# Patient Record
Sex: Female | Born: 1935 | Race: Black or African American | Hispanic: No | Marital: Married | Smoking: Never smoker
Health system: Southern US, Community
[De-identification: ages and names within clinical notes are randomized; demographics above are authoritative.]

## PROBLEM LIST (undated history)

## (undated) DIAGNOSIS — I10 Essential (primary) hypertension: Secondary | ICD-10-CM

## (undated) DIAGNOSIS — E785 Hyperlipidemia, unspecified: Secondary | ICD-10-CM

## (undated) DIAGNOSIS — T7840XA Allergy, unspecified, initial encounter: Secondary | ICD-10-CM

## (undated) HISTORY — DX: Allergy, unspecified, initial encounter: T78.40XA

## (undated) HISTORY — PX: ABDOMINAL HYSTERECTOMY: SHX81

## (undated) HISTORY — DX: Hyperlipidemia, unspecified: E78.5

## (undated) HISTORY — DX: Essential (primary) hypertension: I10

---

## 2015-01-02 DIAGNOSIS — I10 Essential (primary) hypertension: Secondary | ICD-10-CM | POA: Diagnosis not present

## 2015-01-02 DIAGNOSIS — E559 Vitamin D deficiency, unspecified: Secondary | ICD-10-CM | POA: Diagnosis not present

## 2015-01-02 DIAGNOSIS — M8580 Other specified disorders of bone density and structure, unspecified site: Secondary | ICD-10-CM | POA: Diagnosis not present

## 2015-01-04 DIAGNOSIS — K219 Gastro-esophageal reflux disease without esophagitis: Secondary | ICD-10-CM | POA: Diagnosis not present

## 2015-01-04 DIAGNOSIS — I10 Essential (primary) hypertension: Secondary | ICD-10-CM | POA: Diagnosis not present

## 2015-01-04 DIAGNOSIS — E785 Hyperlipidemia, unspecified: Secondary | ICD-10-CM | POA: Diagnosis not present

## 2015-01-04 DIAGNOSIS — G459 Transient cerebral ischemic attack, unspecified: Secondary | ICD-10-CM | POA: Diagnosis not present

## 2015-01-18 DIAGNOSIS — Z1231 Encounter for screening mammogram for malignant neoplasm of breast: Secondary | ICD-10-CM | POA: Diagnosis not present

## 2015-02-22 DIAGNOSIS — M545 Low back pain: Secondary | ICD-10-CM | POA: Diagnosis not present

## 2015-02-22 DIAGNOSIS — R52 Pain, unspecified: Secondary | ICD-10-CM | POA: Diagnosis not present

## 2015-02-22 DIAGNOSIS — M549 Dorsalgia, unspecified: Secondary | ICD-10-CM | POA: Diagnosis not present

## 2015-02-28 DIAGNOSIS — M5416 Radiculopathy, lumbar region: Secondary | ICD-10-CM | POA: Diagnosis not present

## 2015-03-17 DIAGNOSIS — M5416 Radiculopathy, lumbar region: Secondary | ICD-10-CM | POA: Diagnosis not present

## 2015-03-20 DIAGNOSIS — M5416 Radiculopathy, lumbar region: Secondary | ICD-10-CM | POA: Diagnosis not present

## 2015-03-26 DIAGNOSIS — R262 Difficulty in walking, not elsewhere classified: Secondary | ICD-10-CM | POA: Diagnosis not present

## 2015-03-26 DIAGNOSIS — M5416 Radiculopathy, lumbar region: Secondary | ICD-10-CM | POA: Diagnosis not present

## 2015-03-26 DIAGNOSIS — R293 Abnormal posture: Secondary | ICD-10-CM | POA: Diagnosis not present

## 2015-03-26 DIAGNOSIS — M545 Low back pain: Secondary | ICD-10-CM | POA: Diagnosis not present

## 2015-03-28 DIAGNOSIS — R293 Abnormal posture: Secondary | ICD-10-CM | POA: Diagnosis not present

## 2015-03-28 DIAGNOSIS — M545 Low back pain: Secondary | ICD-10-CM | POA: Diagnosis not present

## 2015-03-28 DIAGNOSIS — M5416 Radiculopathy, lumbar region: Secondary | ICD-10-CM | POA: Diagnosis not present

## 2015-03-28 DIAGNOSIS — R262 Difficulty in walking, not elsewhere classified: Secondary | ICD-10-CM | POA: Diagnosis not present

## 2015-04-02 DIAGNOSIS — M545 Low back pain: Secondary | ICD-10-CM | POA: Diagnosis not present

## 2015-04-02 DIAGNOSIS — M5416 Radiculopathy, lumbar region: Secondary | ICD-10-CM | POA: Diagnosis not present

## 2015-04-02 DIAGNOSIS — R262 Difficulty in walking, not elsewhere classified: Secondary | ICD-10-CM | POA: Diagnosis not present

## 2015-04-02 DIAGNOSIS — R293 Abnormal posture: Secondary | ICD-10-CM | POA: Diagnosis not present

## 2015-04-04 DIAGNOSIS — R293 Abnormal posture: Secondary | ICD-10-CM | POA: Diagnosis not present

## 2015-04-04 DIAGNOSIS — M545 Low back pain: Secondary | ICD-10-CM | POA: Diagnosis not present

## 2015-04-04 DIAGNOSIS — M5416 Radiculopathy, lumbar region: Secondary | ICD-10-CM | POA: Diagnosis not present

## 2015-04-04 DIAGNOSIS — R262 Difficulty in walking, not elsewhere classified: Secondary | ICD-10-CM | POA: Diagnosis not present

## 2015-04-09 DIAGNOSIS — M545 Low back pain: Secondary | ICD-10-CM | POA: Diagnosis not present

## 2015-04-09 DIAGNOSIS — M5416 Radiculopathy, lumbar region: Secondary | ICD-10-CM | POA: Diagnosis not present

## 2015-04-09 DIAGNOSIS — R293 Abnormal posture: Secondary | ICD-10-CM | POA: Diagnosis not present

## 2015-04-09 DIAGNOSIS — R262 Difficulty in walking, not elsewhere classified: Secondary | ICD-10-CM | POA: Diagnosis not present

## 2015-04-11 DIAGNOSIS — R262 Difficulty in walking, not elsewhere classified: Secondary | ICD-10-CM | POA: Diagnosis not present

## 2015-04-11 DIAGNOSIS — M545 Low back pain: Secondary | ICD-10-CM | POA: Diagnosis not present

## 2015-04-11 DIAGNOSIS — M5416 Radiculopathy, lumbar region: Secondary | ICD-10-CM | POA: Diagnosis not present

## 2015-04-11 DIAGNOSIS — R293 Abnormal posture: Secondary | ICD-10-CM | POA: Diagnosis not present

## 2015-04-13 DIAGNOSIS — R293 Abnormal posture: Secondary | ICD-10-CM | POA: Diagnosis not present

## 2015-04-13 DIAGNOSIS — R262 Difficulty in walking, not elsewhere classified: Secondary | ICD-10-CM | POA: Diagnosis not present

## 2015-04-13 DIAGNOSIS — M545 Low back pain: Secondary | ICD-10-CM | POA: Diagnosis not present

## 2015-04-13 DIAGNOSIS — M5416 Radiculopathy, lumbar region: Secondary | ICD-10-CM | POA: Diagnosis not present

## 2015-04-18 DIAGNOSIS — R262 Difficulty in walking, not elsewhere classified: Secondary | ICD-10-CM | POA: Diagnosis not present

## 2015-04-18 DIAGNOSIS — R293 Abnormal posture: Secondary | ICD-10-CM | POA: Diagnosis not present

## 2015-04-18 DIAGNOSIS — M545 Low back pain: Secondary | ICD-10-CM | POA: Diagnosis not present

## 2015-04-18 DIAGNOSIS — M5416 Radiculopathy, lumbar region: Secondary | ICD-10-CM | POA: Diagnosis not present

## 2015-04-20 DIAGNOSIS — R262 Difficulty in walking, not elsewhere classified: Secondary | ICD-10-CM | POA: Diagnosis not present

## 2015-04-20 DIAGNOSIS — M545 Low back pain: Secondary | ICD-10-CM | POA: Diagnosis not present

## 2015-04-20 DIAGNOSIS — R293 Abnormal posture: Secondary | ICD-10-CM | POA: Diagnosis not present

## 2015-04-20 DIAGNOSIS — M5416 Radiculopathy, lumbar region: Secondary | ICD-10-CM | POA: Diagnosis not present

## 2015-04-23 DIAGNOSIS — M5416 Radiculopathy, lumbar region: Secondary | ICD-10-CM | POA: Diagnosis not present

## 2015-04-23 DIAGNOSIS — R293 Abnormal posture: Secondary | ICD-10-CM | POA: Diagnosis not present

## 2015-04-23 DIAGNOSIS — M545 Low back pain: Secondary | ICD-10-CM | POA: Diagnosis not present

## 2015-04-23 DIAGNOSIS — R262 Difficulty in walking, not elsewhere classified: Secondary | ICD-10-CM | POA: Diagnosis not present

## 2015-04-25 DIAGNOSIS — R262 Difficulty in walking, not elsewhere classified: Secondary | ICD-10-CM | POA: Diagnosis not present

## 2015-04-25 DIAGNOSIS — M5416 Radiculopathy, lumbar region: Secondary | ICD-10-CM | POA: Diagnosis not present

## 2015-04-25 DIAGNOSIS — M545 Low back pain: Secondary | ICD-10-CM | POA: Diagnosis not present

## 2015-04-25 DIAGNOSIS — R293 Abnormal posture: Secondary | ICD-10-CM | POA: Diagnosis not present

## 2015-04-30 DIAGNOSIS — M5416 Radiculopathy, lumbar region: Secondary | ICD-10-CM | POA: Diagnosis not present

## 2015-04-30 DIAGNOSIS — M545 Low back pain: Secondary | ICD-10-CM | POA: Diagnosis not present

## 2015-04-30 DIAGNOSIS — R293 Abnormal posture: Secondary | ICD-10-CM | POA: Diagnosis not present

## 2015-04-30 DIAGNOSIS — R262 Difficulty in walking, not elsewhere classified: Secondary | ICD-10-CM | POA: Diagnosis not present

## 2015-05-02 DIAGNOSIS — M545 Low back pain: Secondary | ICD-10-CM | POA: Diagnosis not present

## 2015-05-02 DIAGNOSIS — R293 Abnormal posture: Secondary | ICD-10-CM | POA: Diagnosis not present

## 2015-05-02 DIAGNOSIS — M5416 Radiculopathy, lumbar region: Secondary | ICD-10-CM | POA: Diagnosis not present

## 2015-05-02 DIAGNOSIS — R262 Difficulty in walking, not elsewhere classified: Secondary | ICD-10-CM | POA: Diagnosis not present

## 2015-05-04 DIAGNOSIS — R262 Difficulty in walking, not elsewhere classified: Secondary | ICD-10-CM | POA: Diagnosis not present

## 2015-05-04 DIAGNOSIS — M5416 Radiculopathy, lumbar region: Secondary | ICD-10-CM | POA: Diagnosis not present

## 2015-05-04 DIAGNOSIS — R293 Abnormal posture: Secondary | ICD-10-CM | POA: Diagnosis not present

## 2015-05-04 DIAGNOSIS — M545 Low back pain: Secondary | ICD-10-CM | POA: Diagnosis not present

## 2015-05-09 DIAGNOSIS — M545 Low back pain: Secondary | ICD-10-CM | POA: Diagnosis not present

## 2015-05-09 DIAGNOSIS — R293 Abnormal posture: Secondary | ICD-10-CM | POA: Diagnosis not present

## 2015-05-09 DIAGNOSIS — R262 Difficulty in walking, not elsewhere classified: Secondary | ICD-10-CM | POA: Diagnosis not present

## 2015-05-09 DIAGNOSIS — M5416 Radiculopathy, lumbar region: Secondary | ICD-10-CM | POA: Diagnosis not present

## 2015-05-11 DIAGNOSIS — R262 Difficulty in walking, not elsewhere classified: Secondary | ICD-10-CM | POA: Diagnosis not present

## 2015-05-11 DIAGNOSIS — M545 Low back pain: Secondary | ICD-10-CM | POA: Diagnosis not present

## 2015-05-11 DIAGNOSIS — M5416 Radiculopathy, lumbar region: Secondary | ICD-10-CM | POA: Diagnosis not present

## 2015-05-11 DIAGNOSIS — R293 Abnormal posture: Secondary | ICD-10-CM | POA: Diagnosis not present

## 2015-05-14 DIAGNOSIS — M545 Low back pain: Secondary | ICD-10-CM | POA: Diagnosis not present

## 2015-05-14 DIAGNOSIS — M5416 Radiculopathy, lumbar region: Secondary | ICD-10-CM | POA: Diagnosis not present

## 2015-05-14 DIAGNOSIS — R293 Abnormal posture: Secondary | ICD-10-CM | POA: Diagnosis not present

## 2015-05-14 DIAGNOSIS — R262 Difficulty in walking, not elsewhere classified: Secondary | ICD-10-CM | POA: Diagnosis not present

## 2015-05-18 DIAGNOSIS — R262 Difficulty in walking, not elsewhere classified: Secondary | ICD-10-CM | POA: Diagnosis not present

## 2015-05-18 DIAGNOSIS — M5416 Radiculopathy, lumbar region: Secondary | ICD-10-CM | POA: Diagnosis not present

## 2015-05-18 DIAGNOSIS — M545 Low back pain: Secondary | ICD-10-CM | POA: Diagnosis not present

## 2015-05-18 DIAGNOSIS — R293 Abnormal posture: Secondary | ICD-10-CM | POA: Diagnosis not present

## 2015-05-24 DIAGNOSIS — M5416 Radiculopathy, lumbar region: Secondary | ICD-10-CM | POA: Diagnosis not present

## 2015-05-24 DIAGNOSIS — R262 Difficulty in walking, not elsewhere classified: Secondary | ICD-10-CM | POA: Diagnosis not present

## 2015-05-24 DIAGNOSIS — M545 Low back pain: Secondary | ICD-10-CM | POA: Diagnosis not present

## 2015-05-24 DIAGNOSIS — R293 Abnormal posture: Secondary | ICD-10-CM | POA: Diagnosis not present

## 2015-06-01 DIAGNOSIS — K219 Gastro-esophageal reflux disease without esophagitis: Secondary | ICD-10-CM | POA: Diagnosis not present

## 2015-06-01 DIAGNOSIS — E559 Vitamin D deficiency, unspecified: Secondary | ICD-10-CM | POA: Diagnosis not present

## 2015-06-01 DIAGNOSIS — I10 Essential (primary) hypertension: Secondary | ICD-10-CM | POA: Diagnosis not present

## 2015-06-01 DIAGNOSIS — M545 Low back pain: Secondary | ICD-10-CM | POA: Diagnosis not present

## 2015-07-12 DIAGNOSIS — M8589 Other specified disorders of bone density and structure, multiple sites: Secondary | ICD-10-CM | POA: Diagnosis not present

## 2015-08-07 DIAGNOSIS — Z23 Encounter for immunization: Secondary | ICD-10-CM | POA: Diagnosis not present

## 2015-12-03 DIAGNOSIS — E559 Vitamin D deficiency, unspecified: Secondary | ICD-10-CM | POA: Diagnosis not present

## 2015-12-03 DIAGNOSIS — I10 Essential (primary) hypertension: Secondary | ICD-10-CM | POA: Diagnosis not present

## 2015-12-07 DIAGNOSIS — K219 Gastro-esophageal reflux disease without esophagitis: Secondary | ICD-10-CM | POA: Diagnosis not present

## 2015-12-07 DIAGNOSIS — E559 Vitamin D deficiency, unspecified: Secondary | ICD-10-CM | POA: Diagnosis not present

## 2015-12-07 DIAGNOSIS — I10 Essential (primary) hypertension: Secondary | ICD-10-CM | POA: Diagnosis not present

## 2015-12-07 DIAGNOSIS — M8589 Other specified disorders of bone density and structure, multiple sites: Secondary | ICD-10-CM | POA: Diagnosis not present

## 2015-12-07 DIAGNOSIS — E785 Hyperlipidemia, unspecified: Secondary | ICD-10-CM | POA: Diagnosis not present

## 2016-01-21 DIAGNOSIS — Z1231 Encounter for screening mammogram for malignant neoplasm of breast: Secondary | ICD-10-CM | POA: Diagnosis not present

## 2016-02-14 ENCOUNTER — Encounter: Payer: Self-pay | Admitting: Family Medicine

## 2016-02-14 ENCOUNTER — Ambulatory Visit (INDEPENDENT_AMBULATORY_CARE_PROVIDER_SITE_OTHER): Payer: Medicare Other | Admitting: Family Medicine

## 2016-02-14 VITALS — BP 118/76 | HR 88 | Temp 98.4°F | Resp 14 | Ht 61.0 in | Wt 120.4 lb

## 2016-02-14 DIAGNOSIS — I10 Essential (primary) hypertension: Secondary | ICD-10-CM | POA: Diagnosis not present

## 2016-02-14 DIAGNOSIS — J309 Allergic rhinitis, unspecified: Secondary | ICD-10-CM | POA: Diagnosis not present

## 2016-02-14 DIAGNOSIS — E7800 Pure hypercholesterolemia, unspecified: Secondary | ICD-10-CM

## 2016-02-14 MED ORDER — FLUTICASONE PROPIONATE 50 MCG/ACT NA SUSP: 1.0000 | Freq: Two times a day (BID) | NASAL | Status: AC

## 2016-02-14 NOTE — Progress Notes (Signed)
HPI:  Ms. Kim GrieveHester Dahm is a very pleasant 82 y.o.female , who is here today to establish care with me.  She just moved from [place] in 08/2015. [place] and lives with husband.  Last physical: 11/2015.   She has history of HTN and allergies. Independent ADL's and IADL's.  Has the following chronic problems that require follow up and concerns today:   Today she is complaining of 5 days of sneezing, nasal congestion, epiphora, and post nasal drip. She has a mild cough, attributed to post nasal drip. She is concerned about a "bad infection" causing symptoms. She has not noted difficulty breathing, chest pain, or wheezing. Her husband was recently sick. No recent travel or insect bite. Took a couple Zyrtec and helped some. No fever or chills.   Hypertension: Dx in 2008 after a stressful event.  Currently she is on Amlodipine 2.5 mg 1/2 tab daily. She is taking medications as instructed, no side effects reported. She occasionally skips medication, which was recommended by former PCP, when her BP is "low", which has not been very often in the past year. Home BP's low 100's/70's, in the past year she remembers 1-2 SBP's upper 130's or lowe 140's.  She has not noted unusual headache, visual changes, exertional chest pain, dyspnea,  focal weakness, or edema. She also has history of mild hypercholesterolemia, currently she is on nonpharmacologic treatment.  Labs done 12/03/15 CBC, CMP, FLP, and U/A otherwise normal. TC 240, HDL 105, LDL 110, TG 124.    REVIEW OF SYSTEMS:  General: Negative for fever, chills,fatigue, abnormal weight loss, or changes in appetite.  Eyes: Negative for conjunctival erythema, or visual changes. + Eye pruritus and epiphora.  ENT: Negative for earache, hearing loss, or ear drainage. + rhinorrhea, nasal congestion, intermittent sinus pressure. No epistaxis. Negative for oral lesions, odynophagia, dysphagia.  Neck: negative  for swollen glands or masses.  Cardiac: Negative for chest pain, exertional dyspnea, irregular HR, claudication, cold extremities, or edema.  Respiratory: Negative for dyspnea, wheezing, or hemoptysis. + Productive cough with occasional brownish sputum.  Abdomen: Negative for abdominal pain, changes in bowel habits, heartburn, blood in stool or melena, nausea, or vomiting.  GU: Negative for dysuria, urinary frequency or urgency, gross hematuria, urine incontinence.  MS: No arthralgias, joint erythema or edema, joint stiffness, or limitation of ROM.  Neurologic: No confusion,  focal weakness, numbness or tingling, frequent/severe headaches, or tremor.  Psychiatric: No depression, anxiety, or sleep disorder.  Skin: Negative for rash, ulcers, or skin color changes.    Past Medical History  Diagnosis Date  . Allergy   . Hypertension   . Hyperlipidemia     Past Surgical History  Procedure Laterality Date  . Abdominal hysterectomy      Family History  Problem Relation Age of Onset  . Heart disease Father     Social History   Social History  . Marital Status: Married    Spouse Name: N/A  . Number of Children: N/A  . Years of Education: N/A   Social History Main Topics  . Smoking status: Never Smoker   . Smokeless tobacco: None  . Alcohol Use: 0.0 oz/week    0 Standard drinks or equivalent per week  . Drug Use: None  . Sexual Activity: Not Asked   Other Topics Concern  . None   Social History Narrative  . None    No current outpatient prescriptions on file prior to visit.   No  current facility-administered medications on file prior to visit.     EXAM:  Filed Vitals:   02/14/16 0956  BP: 118/76  Pulse: 88  Temp: 98.4 F (36.9 C)  Resp: 14   SpO2 Readings from Last 3 Encounters:  02/14/16 98%    Body mass index is 22.76 kg/(m^2).   GENERAL: vitals reviewed and listed above. Well developed and nourished, appears well hydrated and in no acute  distress.  ENT: atraumatic, conjuntiva clear, PERRL and EOMI bilateral. No obvious abnormalities on inspection of external nose and ears. Excess cerumen bilateral, so TM's partially seen, hypertrophic turbinates, no sinus tenderness with percussion.  NECK: no obvious masses on inspection. No lymphadenopathy or thyromegaly.  LUNGS: clear to auscultation bilaterally, no wheezes, rales or rhonchi, good air movement. Occasional productive cough during visit, no respiratory distress.  CV: Regular rate and rhythm, no murmurs appreciated, no peripheral edema. DP pulses are present bilaterally.  ABDOMEN: Soft, no tender, no masses or hepatomegaly appreciated.  MS: moves all extremities without noticeable abnormality  NEURO: Alert and oriented x 3, no focal deficit appreciated, normal gait.  PSYCH: pleasant and cooperative, no obvious depression or anxiety  ASSESSMENT AND PLAN:  Discussed the following assessment and plan:   -Essential hypertension - since 2008  Blood pressure readings seem to be in the lower normal range, we discussed current recommendations goals for blood pressure at her age, <150/90, as well as possible complications from hypotension. Since she is taking a low-dose of amlodipine I think we can try try to stop the medication and continue nonpharmacologic treatment. She agrees with plan, she will monitor her blood pressure periodically, and she will follow in 2-3 months. Continue healthy diet and regular physical activity. Low-salt diet also recommended.   -Allergic rhinitis, unspecified allergic rhinitis type  Symptoms she is reporting today do not suggest sinus infection but rather allergic rhinitis, we discussed some adverse effects of antibiotic therapy and she agrees to hold on this for now. I recommended nasal steroid spray, Flonase, and over-the-counter antihistaminic, I think Allegra 180 mg is adequate option for her. She will monitor for complications and will let  me know if symptoms get worse.   -Pure hypercholesterolemia  Mild. I don't think pharmacologic treatment is recommended at this time, continue with low-fat diet Lab can be repeated February 2018.  We also talked some about  Aspirin for primary prevention (in certain populations) and some risk of this medication at her age. For now if she wants to continue taking it she can do so but we will discuss in detail during future visits.     -We reviewed the PMH, PSH, FH, SH, Meds and Allergies. -We addressed current concerns per orders and patient instructions. -We have asked for records for pertinent exams, studies, vaccines and notes from previous providers.    -Patient advised to return or notify a doctor immediately if symptoms worsen or persist or new concerns arise.      Kincaid Tiger G. [place], MD  [facility]. [place] office.

## 2016-02-14 NOTE — Patient Instructions (Signed)
A few things to remember from today's visit:   1. Essential hypertension  Goal < 150/90.  Monitoring blood pressure. Do not add salt food. DASH diet recommended: high in vegetables, fruits, low-fat dairy products, whole grains, poultry, fish, and nuts; and low in sweets, sugar-sweetened beverages, and red meats.  Mediterranean diet.   2. Allergic rhinitis, unspecified allergic rhinitis type  Allegra 180 mg daily. Saline drops. Flonase nasal spray.   3. Pure hypercholesterolemia  Low fat diet, no medication needed.        If you sign-up for My chart, you can communicate easier with us in case you have any question or concern.

## 2016-05-16 ENCOUNTER — Encounter: Payer: Self-pay | Admitting: Family Medicine

## 2016-05-16 ENCOUNTER — Ambulatory Visit (INDEPENDENT_AMBULATORY_CARE_PROVIDER_SITE_OTHER): Payer: Medicare Other | Admitting: Family Medicine

## 2016-05-16 VITALS — BP 124/76 | HR 85 | Resp 12 | Ht 61.0 in | Wt 125.0 lb

## 2016-05-16 DIAGNOSIS — I10 Essential (primary) hypertension: Secondary | ICD-10-CM | POA: Diagnosis not present

## 2016-05-16 DIAGNOSIS — J309 Allergic rhinitis, unspecified: Secondary | ICD-10-CM | POA: Diagnosis not present

## 2016-05-16 DIAGNOSIS — R350 Frequency of micturition: Secondary | ICD-10-CM | POA: Diagnosis not present

## 2016-05-16 LAB — POCT URINALYSIS DIPSTICK
Bilirubin, UA: NEGATIVE
Blood, UA: NEGATIVE
GLUCOSE UA: NEGATIVE
Ketones, UA: NEGATIVE
LEUKOCYTES UA: NEGATIVE
NITRITE UA: NEGATIVE
PROTEIN UA: NEGATIVE
UROBILINOGEN UA: 0.2
pH, UA: 5.5

## 2016-05-16 NOTE — Progress Notes (Signed)
Pre visit review using our clinic review tool, if applicable. No additional management support is needed unless otherwise documented below in the visit note. 

## 2016-05-16 NOTE — Patient Instructions (Addendum)
A few things to remember from today's visit:   Essential hypertension  Urine frequency - Plan: POC Urinalysis Dipstick  Allergic rhinitis, unspecified allergic rhinitis type     Ms.Kim Yang, today we have followed on some of your chronic medical problems and they seem to be stable, so no changes in current management today. In regard to Flonase nasal spray, you can use it as needed. I recommend resuming it daily about 2 weeks before spring starts.  Urine negative today. Monitor for new symptoms and is burning or new urinary symptoms please let me know through MyChart.  Review medication list and be sure if is accurate.  -Remember a healthy diet and regular physical activity are very important for prevention as well as for well being; they also help with many chronic problems, decreasing the need of adding new medications and delaying or preventing possible complications.  Remember to arrange your follow up appt before leaving today. Please follow sooner than planned if a new concern arises.

## 2016-05-16 NOTE — Progress Notes (Signed)
HPI:   Kim Yang is a 82 y.o. female, who is here today to follow on last OV, she was seen on 02/14/16.   Hypertension: Currently she is on non pharmacologic treatment, 02/14/16 Amlodipine was discontinued because low normal BP's.   She has not noted unusual headache, visual changes, exertional chest pain, dyspnea,  focal weakness, or edema.  BP readings at home 120's/70's.  She is still following a healthy diet.  11/2015 CBC,CMP, and lipid otherwise normal, except for TC 240, HDL 105.  Allergic rhinitis: Symptoms improved with Flonase nasal spray. Mild rhinorrhea, intermittently. No cough, odynophagia, or sinus pressure.   Concerns today: a week of urine frequency.    Denies abdominal pain, nausea, vomiting, or back pain. No changes in bowel habits.. No dysuria, urgency, or incontinence. No gross hematuria. . + Nocturia x 1.   Review of Systems  Constitutional: Negative for activity change, appetite change, fatigue, fever and unexpected weight change.  HENT: Negative for mouth sores, nosebleeds and trouble swallowing.   Eyes: Negative for redness and visual disturbance.  Respiratory: Negative for cough, shortness of breath and wheezing.   Cardiovascular: Negative for chest pain, palpitations and leg swelling.  Gastrointestinal: Negative for abdominal pain, nausea and vomiting.       Negative for changes in bowel habits.  Genitourinary: Positive for frequency. Negative for decreased urine volume, difficulty urinating, dysuria, flank pain, hematuria, urgency, vaginal bleeding and vaginal discharge.  Allergic/Immunologic: Positive for environmental allergies.  Neurological: Negative for seizures, syncope, weakness, numbness and headaches.  Psychiatric/Behavioral: Negative.       Current Outpatient Prescriptions on File Prior to Visit  Medication Sig Dispense Refill  . Ascorbic Acid (VITAMIN C) 1000 MG tablet Take by mouth.    Marland Kitchen aspirin EC 81 MG  tablet Take by mouth.    . fluticasone (FLONASE) 50 MCG/ACT nasal spray Place 1 spray into both nostrils 2 (two) times daily. 16 g 6  . Multiple Vitamin (MULTI VITAMIN DAILY PO) Take by mouth.    . Omega-3 Fatty Acids (FISH OIL) 1000 MG CAPS Take by mouth.     No current facility-administered medications on file prior to visit.      Past Medical History:  Diagnosis Date  . Allergy   . Hyperlipidemia   . Hypertension    Allergies  Allergen Reactions  . Amoxicillin Anaphylaxis  . Penicillins Rash    Social History   Social History  . Marital status: Married    Spouse name: N/A  . Number of children: N/A  . Years of education: N/A   Social History Main Topics  . Smoking status: Never Smoker  . Smokeless tobacco: None  . Alcohol use 0.0 oz/week  . Drug use: Unknown  . Sexual activity: Not Asked   Other Topics Concern  . None   Social History Narrative  . None    Vitals:   05/16/16 0846  BP: 124/76  Pulse: 85  Resp: 12   Body mass index is 23.62 kg/m.   O2 sat RA 97%   Physical Exam  Nursing note and vitals reviewed. Constitutional: She is oriented to person, place, and time. She appears well-developed. No distress.  HENT:  Head: Atraumatic.  Eyes: Conjunctivae and EOM are normal. Pupils are equal, round, and reactive to light.  Cardiovascular: Normal rate and regular rhythm.   No murmur heard. Respiratory: Effort normal and breath sounds normal. No respiratory distress.  GI: Soft. She exhibits no mass. There  is no hepatomegaly. There is no tenderness. There is no CVA tenderness.  Musculoskeletal: She exhibits no edema.  Lymphadenopathy:    She has no cervical adenopathy.  Neurological: She is alert and oriented to person, place, and time. She has normal strength. Coordination normal.  Skin: Skin is warm. No erythema.  Psychiatric: She has a normal mood and affect.  Well groomed, good eye contact.      ASSESSMENT AND PLAN:     Alexana was  seen today for follow-up.  Diagnoses and all orders for this visit:  Essential hypertension  Adequately controlled on non pharmacologic treatment.  DASH diet recommended. Eye exam recommended annually. F/U in 6 months (with physical), before if needed.  Urine frequency  No other urinary symptom. Urine dip today negative. Monitor for new symptoms, in which case we will arrange lab appt (u/a and Ucx) and may consider empiric treatment. She agrees with plan.  -     POC Urinalysis Dipstick  Allergic rhinitis, unspecified allergic rhinitis type  Improved. She can continue Flonase nasal spray as needed, recommend using more frequent around Spring. F/U as needed.        -Ms. Eeva Evaleen Cranston was advised to return sooner than planned today if new concerns arise.       Ica Daye G. [place], MD  [facility]. [place] office.

## 2016-08-22 DIAGNOSIS — Z23 Encounter for immunization: Secondary | ICD-10-CM | POA: Diagnosis not present

## 2016-11-06 DIAGNOSIS — H25013 Cortical age-related cataract, bilateral: Secondary | ICD-10-CM | POA: Diagnosis not present

## 2016-11-06 DIAGNOSIS — H2513 Age-related nuclear cataract, bilateral: Secondary | ICD-10-CM | POA: Diagnosis not present

## 2016-12-08 ENCOUNTER — Telehealth: Payer: Self-pay | Admitting: Family Medicine

## 2016-12-08 NOTE — Telephone Encounter (Addendum)
Pt states she does not understand why she cannot come in fasting prior to her appt for a cpe.  Pt states she called medicare and they advised no restrictions. Pt would like us to call medicare to confirm.  Advise pt we do not usually call. The rules has changed since last year when this appt was made.. Pt very adamant that she should come in prior to this appt.  Pt would like a call back.  Does not understand and would like further explanation.  Pt has appt wed, 12/10/2016.

## 2016-12-09 DIAGNOSIS — E7800 Pure hypercholesterolemia, unspecified: Secondary | ICD-10-CM | POA: Insufficient documentation

## 2016-12-09 NOTE — Progress Notes (Signed)
HPI:   Kim Yang is a 83 y.o. female, who is here today for her routine Medicare preventive visit and to follow on some chronic medical problems.  She goes to the fitness [facility], elliptical, and walking. [facility] [place].  Sleeps well, 7-8 hours.    Hypertension:   Currently on non pharmacologic treatment. Amlodipine 2.5 mg was discontinued a few months ago, no side effects reported. BP reading at home: 140-150's/90's.  She has not noted unusual headache, visual changes, exertional chest pain, dyspnea,  focal weakness, or edema. CMP in normal range 12/03/15.   HLD: Last FLP 11/2015: TC 240,LDL 110,HDL 105, and TG 124.  She is on non pharmacologic treatment.   She does follow a healthy diet.  She also has Hx of allergic rhinitis. She is on Flonase nasal spray.Usually Fall is worse and this past fall she did well, had no problems.  She has not noted fever,chills, sinus pain, sore throat, or worsening nasal congestion.    She lives with her husband. Independent ADL's and IADL's. No falls in the past year and denies depression symptoms.  Activities of Daily Living:  Any difficulty performing the following activities:  Driving? No Using a phone? No Managing money?  No Feeding yourself? No Getting from bed to chair? No Climbing a flight of stairs? No Preparing food and eating?: No Bathing or showering? No Getting dressed: No Getting to the toilet? No Using the toilet: No Moving around from place to place: No In the past year have you fallen or had a near fall?:No    DEXA within the past 3 years and normal, per pt report.   Advance directives: She has POA and living will. Her daughter is a Oceanographer and she calls regularly (every night) and according to pt, she checks her cognitive function in regular bases. "normal" memory.She visits twice per year.  A second daughter,Lisa, she is  moving to [place], [place] in the next few months.   She follows with ophthalmologists annually, Dr Lissa Merlin. Last OV 11/06/16.   Concerns today: She would like labs done.  She also mentions that a few days ago she had mild constipation but improved when she increased fiber intake.She denies associated abdominal pain, blood in stools,or abnormal wt loss. No prior Hx of constipation and no changes in her diet. Maternal aunt with Hx of colon cancer.  She states that very seldom she has some abdominal mild discomfort, does not last long, and does not recall when was the last time she had pain and points to RLQ. She had colonoscopy done a few years ago,deneis any abnormality.   CV risk factors: HTN and HLD. She takes Aspirin 81 mg daily, denies side effects.    Review of Systems  Constitutional: Negative for activity change, appetite change, fatigue and fever.  HENT: Negative for congestion, mouth sores, nosebleeds, rhinorrhea and trouble swallowing.   Eyes: Negative for pain, redness and visual disturbance.  Respiratory: Negative for cough, shortness of breath and wheezing.   Cardiovascular: Negative for chest pain, palpitations and leg swelling.  Gastrointestinal: Positive for abdominal pain (seldom) and constipation. Negative for blood in stool, nausea and vomiting.  Endocrine: Negative for cold intolerance, heat intolerance, polydipsia, polyphagia and polyuria.  Genitourinary: Negative for difficulty urinating, dysuria, hematuria, urgency, vaginal bleeding and vaginal discharge.  Musculoskeletal: Negative for gait problem and myalgias.  Skin: Negative for rash.  Allergic/Immunologic: Positive for environmental allergies.  Neurological: Negative  for syncope, weakness, numbness and headaches.  Psychiatric/Behavioral: Negative for confusion and sleep disturbance. The patient is not nervous/anxious.       Current Outpatient Prescriptions on File Prior to Visit  Medication Sig Dispense  Refill  . Ascorbic Acid (VITAMIN C) 1000 MG tablet Take by mouth.    Marland Kitchen. aspirin EC 81 MG tablet Take by mouth.    . fluticasone (FLONASE) 50 MCG/ACT nasal spray Place 1 spray into both nostrils 2 (two) times daily. 16 g 6  . Multiple Vitamin (MULTI VITAMIN DAILY PO) Take by mouth.    . Multiple Vitamins-Minerals (VITAMIN D3 COMPLETE PO) Take 1 tablet by mouth.    . Omega-3 Fatty Acids (FISH OIL) 1000 MG CAPS Take by mouth.     No current facility-administered medications on file prior to visit.      Past Medical History:  Diagnosis Date  . Allergy   . Hyperlipidemia   . Hypertension    Allergies  Allergen Reactions  . Amoxicillin Anaphylaxis  . Penicillins Rash   Family History  Problem Relation Age of Onset  . Heart disease Father   . Cancer Maternal Aunt     colon    Social History   Social History  . Marital status: Married    Spouse name: N/A  . Number of children: N/A  . Years of education: N/A   Social History Main Topics  . Smoking status: Never Smoker  . Smokeless tobacco: Not on file  . Alcohol use 0.0 oz/week  . Drug use: Unknown  . Sexual activity: Not on file   Other Topics Concern  . Not on file   Social History Narrative  . No narrative on file    Vitals:   12/10/16 0942  BP: (!) 162/82  Pulse: 76  Resp: 12   Body mass index is 24.19 kg/m.   Wt Readings from Last 3 Encounters:  12/10/16 128 lb (58.1 kg)  05/16/16 125 lb (56.7 kg)  02/14/16 120 lb 6.4 oz (54.6 kg)    Physical Exam  Nursing note and vitals reviewed. Constitutional: She is oriented to person, place, and time. She appears well-developed and well-nourished. No distress.  HENT:  Head: Atraumatic.  Eyes: Conjunctivae and EOM are normal. Pupils are equal, round, and reactive to light.  Neck: No JVD present. No tracheal deviation present. No thyroid mass and no thyromegaly present.  Cardiovascular: Normal rate and regular rhythm.   No murmur heard. Pulses:       Dorsalis pedis pulses are 2+ on the right side, and 2+ on the left side.  Respiratory: Effort normal and breath sounds normal. No respiratory distress.  GI: Soft. Bowel sounds are normal. She exhibits no mass. There is no hepatomegaly. There is no tenderness.  Musculoskeletal: She exhibits no edema or tenderness.  Lymphadenopathy:    She has no cervical adenopathy.  Neurological: She is alert and oriented to person, place, and time. She has normal strength. No cranial nerve deficit. Coordination normal.  Reflex Scores:      Bicep reflexes are 2+ on the right side and 2+ on the left side.      Patellar reflexes are 2+ on the right side and 2+ on the left side. Stable gait with no assistance. Get up and go test < 30 seconds.  Skin: Skin is warm. No erythema.  Psychiatric: She has a normal mood and affect. Cognition and memory are normal.  Well groomed, good eye contact.    ASSESSMENT AND  PLAN:   Naesha was seen today for annual exam.  Diagnoses and all orders for this visit:  Medicare annual wellness visit, subsequent   We discussed the importance of regular physical activity and healthy diet for prevention of chronic illness and/or complications. Preventive guidelines reviewed. Vaccination up to date. Aspirin for primary prevention discussed and wishes to continue. Ca++ and vit D supplementation recommended. Fall precautions discussed.  Cognitive evaluation intact based on observation/examination today. Medicare Annual Wellness Exam Questionnaire reviewed and scanned.  Next CPE in 1 year.  Other problems:  Essential hypertension  Not well controlled. Possible complications of elevated BP discussed. Amlodipine 2.5 mg added today. Some side effects discussed. Annual eye examination. BP check at home. F/U  In 6 weeks.  -     Basic metabolic panel -     CBC -     TSH -     amLODipine (NORVASC) 2.5 MG tablet; Take 1 tablet (2.5 mg total) by mouth daily.  Allergic  rhinitis, unspecified chronicity, unspecified seasonality, unspecified trigger  Well controlled. No changes in current management.   Hypercholesterolemia  No changes in current management, will follow labs done today and will give further recommendations accordingly. F/U in 12 months.  -     Lipid panel  Constipation, unspecified constipation type  We discussed possible causes, benign as well as the more concerning ones. Treatment options: fiber,adequate fluid intake,and OTC Miralax as needed. Further evaluation options also discussed since this is a new problem,she is not interested in colonoscopy or further work-up. Instructed about warning signs. F/U in 6 weeks.  -     TSH     -Ms. Ardell Alexa Blish was advised to return sooner than planned today if new concerns arise.       Betty G. [place], MD  [facility]. [place] office.

## 2016-12-10 ENCOUNTER — Ambulatory Visit (INDEPENDENT_AMBULATORY_CARE_PROVIDER_SITE_OTHER): Payer: Medicare Other | Admitting: Family Medicine

## 2016-12-10 VITALS — BP 162/82 | HR 76 | Resp 12 | Ht 61.0 in | Wt 128.0 lb

## 2016-12-10 DIAGNOSIS — I10 Essential (primary) hypertension: Secondary | ICD-10-CM

## 2016-12-10 DIAGNOSIS — E7800 Pure hypercholesterolemia, unspecified: Secondary | ICD-10-CM | POA: Diagnosis not present

## 2016-12-10 DIAGNOSIS — K5900 Constipation, unspecified: Secondary | ICD-10-CM

## 2016-12-10 DIAGNOSIS — J309 Allergic rhinitis, unspecified: Secondary | ICD-10-CM | POA: Diagnosis not present

## 2016-12-10 DIAGNOSIS — Z0000 Encounter for general adult medical examination without abnormal findings: Secondary | ICD-10-CM | POA: Diagnosis not present

## 2016-12-10 LAB — CBC
HCT: 40.4 % (ref 36.0–46.0)
Hemoglobin: 13.4 g/dL (ref 12.0–15.0)
MCHC: 33.2 g/dL (ref 30.0–36.0)
MCV: 96.1 fl (ref 78.0–100.0)
Platelets: 282 10*3/uL (ref 150.0–400.0)
RBC: 4.2 Mil/uL (ref 3.87–5.11)
RDW: 14.3 % (ref 11.5–15.5)
WBC: 6.8 10*3/uL (ref 4.0–10.5)

## 2016-12-10 LAB — BASIC METABOLIC PANEL
BUN: 16 mg/dL (ref 6–23)
CO2: 28 meq/L (ref 19–32)
CREATININE: 0.79 mg/dL (ref 0.40–1.20)
Calcium: 9.5 mg/dL (ref 8.4–10.5)
Chloride: 104 mEq/L (ref 96–112)
GFR: 87.39 mL/min (ref >60.00)
GLUCOSE: 77 mg/dL (ref 70–99)
Potassium: 3.8 mEq/L (ref 3.5–5.1)
Sodium: 140 mEq/L (ref 135–145)

## 2016-12-10 LAB — LIPID PANEL
CHOL/HDL RATIO: 3
Cholesterol: 236 mg/dL — ABNORMAL HIGH (ref 0–200)
HDL: 90.3 mg/dL (ref >39.00)
LDL Cholesterol: 127 mg/dL — ABNORMAL HIGH (ref 0–99)
NONHDL: 145.86
TRIGLYCERIDES: 92 mg/dL (ref 0.0–149.0)
VLDL: 18.4 mg/dL (ref 0.0–40.0)

## 2016-12-10 LAB — TSH: TSH: 3.41 u[IU]/mL (ref 0.35–4.50)

## 2016-12-10 MED ORDER — AMLODIPINE BESYLATE 2.5 MG PO TABS: 2.5000 mg | ORAL_TABLET | Freq: Every day | ORAL | 0 refills | Status: DC

## 2016-12-10 NOTE — Patient Instructions (Addendum)
A few things to remember from today's visit:   Essential hypertension - Plan: Basic metabolic panel, CBC, TSH, amLODipine (NORVASC) 2.5 MG tablet  Allergic rhinitis, unspecified chronicity, unspecified seasonality, unspecified trigger  Hypercholesterolemia - Plan: Lipid panel  Constipation, unspecified constipation type   Miralax q 2 days .   A few tips:  -As we age balance is not as good as it was, so there is a higher risks for falls. Please remove small rugs and furniture that is "in your way" and could increase the risk of falls. Stretching exercises may help with fall prevention: Yoga and Tai Chi are some examples. Low impact exercise is better, so you are not very achy the next day.  -Sun screen and avoidance of direct sun light recommended. Caution with dehydration, if working outdoors be sure to drink enough fluids.  - Some medications are not safe as we age, increases the risk of side effects and can potentially interact with other medication you are also taken;  including some of over the counter medications. Be sure to let me know when you start a new medication even if it is a dietary/vitamin supplement.   -Healthy diet low in red meet/animal fat and sugar + regular physical activity is recommended.      Please be sure medication list is accurate. If a new problem present, please set up appointment sooner than planned today.        

## 2016-12-14 ENCOUNTER — Encounter: Payer: Self-pay | Admitting: Family Medicine

## 2016-12-15 ENCOUNTER — Telehealth: Payer: Self-pay | Admitting: Family Medicine

## 2016-12-15 NOTE — Telephone Encounter (Signed)
Patient would also like a copy of her lab results mailed to her.

## 2016-12-15 NOTE — Telephone Encounter (Signed)
Patient would like a referral for a mammogram and she forgot to ask during her physical. [facility].   Info: [facility], [address] [facility] [address] [place], [place], [place] [address] [phone]  Contact Info: [phone]

## 2016-12-16 ENCOUNTER — Other Ambulatory Visit: Payer: Self-pay | Admitting: Family Medicine

## 2016-12-16 DIAGNOSIS — Z1239 Encounter for other screening for malignant neoplasm of breast: Secondary | ICD-10-CM

## 2016-12-16 NOTE — Telephone Encounter (Signed)
Labs have already been mailed  

## 2016-12-16 NOTE — Telephone Encounter (Signed)
Mammogram order placed. Lab results can be mailed to her addressed. Thanks, BJ

## 2017-01-21 ENCOUNTER — Ambulatory Visit: Payer: Medicare Other | Admitting: Family Medicine

## 2017-01-21 NOTE — Progress Notes (Signed)
HPI:   Ms.Lucienne Emiliya Chretien is a 83 y.o. female, who is here today to follow on recent OV.   She was seen on 12/10/16. She was c/o abdominal pain and constipation, symptoms have resolved. OTC Miralax recommended, took it for a few days and has not needed it for 2-3 weeks ago. She increased fiber intake.  Bowel movement 1-2 times daily, "every thing is fine,feels good."   HTN: Last OV Amlodipine 2.5 mg was started because BP was elevated. She has years Hx of HTN, Amlodipine was discontinued about a year ago because low BP's,she was able to control BP with non pharmacologic treatment. She is following a low salt diet.  BP readings at home 110-117/70's.  Denies severe/frequent headache, visual changes, chest pain, dyspnea, palpitation, claudication, focal weakness, or significant LE edema.  Lab Results  Component Value Date   CREATININE 0.79 12/10/2016   BUN 16 12/10/2016   NA 140 12/10/2016   K 3.8 12/10/2016   CL 104 12/10/2016   CO2 28 12/10/2016   She would like to go through results of labs done last OV,states that she did not receive copy of labs as she requested.It seems like labs were mailed in 12/2016.   Hyperlipidemia:  Currently on non pharmacologic treatment. Following a low fat diet: Yes.   Lab Results  Component Value Date   CHOL 236 (H) 12/10/2016   HDL 90.30 12/10/2016   LDLCALC 127 (H) 12/10/2016   TRIG 92.0 12/10/2016   CHOLHDL 3 12/10/2016    Review of Systems  Constitutional: Negative for activity change, appetite change, fatigue, fever and unexpected weight change.  HENT: Negative for mouth sores, nosebleeds and trouble swallowing.   Eyes: Negative for redness and visual disturbance.  Respiratory: Negative for cough, shortness of breath and wheezing.   Cardiovascular: Negative for chest pain, palpitations and leg swelling.  Gastrointestinal: Negative for abdominal pain, blood in stool, constipation, nausea and vomiting.    Genitourinary: Negative for decreased urine volume and hematuria.  Musculoskeletal: Negative for gait problem and myalgias.  Neurological: Negative for syncope, weakness and headaches.  Psychiatric/Behavioral: Negative for confusion. The patient is not nervous/anxious.       Current Outpatient Prescriptions on File Prior to Visit  Medication Sig Dispense Refill  . Ascorbic Acid (VITAMIN C) 1000 MG tablet Take by mouth.    Marland Kitchen aspirin EC 81 MG tablet Take by mouth.    . fluticasone (FLONASE) 50 MCG/ACT nasal spray Place 1 spray into both nostrils 2 (two) times daily. 16 g 6  . Multiple Vitamin (MULTI VITAMIN DAILY PO) Take by mouth.    . Multiple Vitamins-Minerals (VITAMIN D3 COMPLETE PO) Take 1 tablet by mouth.    . Omega-3 Fatty Acids (FISH OIL) 1000 MG CAPS Take by mouth.     No current facility-administered medications on file prior to visit.      Past Medical History:  Diagnosis Date  . Allergy   . Hyperlipidemia   . Hypertension    Allergies  Allergen Reactions  . Amoxicillin Anaphylaxis  . Penicillins Rash    Social History   Social History  . Marital status: Married    Spouse name: N/A  . Number of children: N/A  . Years of education: N/A   Social History Main Topics  . Smoking status: Never Smoker  . Smokeless tobacco: Never Used  . Alcohol use 0.0 oz/week     Comment: occasional  . Drug use: No  . Sexual activity:  Not Currently    Partners: Male   Other Topics Concern  . None   Social History Narrative  . None    Vitals:   01/22/17 0823  BP: 122/70  Pulse: 79  Resp: 12   Body mass index is 24.02 kg/m.   Physical Exam  Nursing note and vitals reviewed. Constitutional: She is oriented to person, place, and time. She appears well-developed and well-nourished. No distress.  HENT:  Head: Atraumatic.  Mouth/Throat: Oropharynx is clear and moist and mucous membranes are normal.  Eyes: Conjunctivae and EOM are normal.  Cardiovascular: Normal  rate and regular rhythm.   No murmur heard. Pulses:      Dorsalis pedis pulses are 2+ on the right side, and 2+ on the left side.  Respiratory: Effort normal and breath sounds normal. No respiratory distress.  GI: Soft. She exhibits no mass. There is no hepatomegaly. There is no tenderness.  Musculoskeletal: She exhibits edema (Trace pitting LE edema,bilateral.). She exhibits no tenderness.  Lymphadenopathy:    She has no cervical adenopathy.  Neurological: She is alert and oriented to person, place, and time. She has normal strength. Coordination and gait normal.  Skin: Skin is warm. No erythema.  Psychiatric: She has a normal mood and affect.  Well groomed, good eye contact.    ASSESSMENT AND PLAN:   Dorathy was seen today for follow-up.  Diagnoses and all orders for this visit:  Constipation, unspecified constipation type  Resolved. Continue adequate fiber and fluid intake. Instructed about warning signs. Follow-up as needed.  Essential hypertension  Adequately controlled. She would like Amlodipine 5 mg,so she can split (to continue 2.5 mg) but will last longer. Rx sent to her pharmacy. Some side effects of amlodipine discussed. DASH-low salt diet recommended. Eye exam is current. F/U in 6 months, before if needed.  -     amLODipine (NORVASC) 5 MG tablet; 1/2 tab daily  Hypercholesterolemia  For now she will continue nonpharmacologic treatment, low-fat diet. We will follow annually.    Face to face 8:44 am to 9:03 am. > 50% of visit dedicated to discussion of possible complications of hypertension, side effects of medications, labs review, and plan of care in general.    Ammy Lienhard G. [place], MD  [facility]. [place] office.

## 2017-01-22 ENCOUNTER — Ambulatory Visit (INDEPENDENT_AMBULATORY_CARE_PROVIDER_SITE_OTHER): Payer: Medicare Other | Admitting: Family Medicine

## 2017-01-22 ENCOUNTER — Encounter: Payer: Self-pay | Admitting: Family Medicine

## 2017-01-22 VITALS — BP 122/70 | HR 79 | Resp 12 | Ht 61.0 in | Wt 127.1 lb

## 2017-01-22 DIAGNOSIS — K5900 Constipation, unspecified: Secondary | ICD-10-CM | POA: Diagnosis not present

## 2017-01-22 DIAGNOSIS — I10 Essential (primary) hypertension: Secondary | ICD-10-CM | POA: Diagnosis not present

## 2017-01-22 DIAGNOSIS — E7800 Pure hypercholesterolemia, unspecified: Secondary | ICD-10-CM | POA: Diagnosis not present

## 2017-01-22 MED ORDER — AMLODIPINE BESYLATE 5 MG PO TABS: ORAL_TABLET | ORAL | 1 refills | Status: AC

## 2017-01-22 NOTE — Patient Instructions (Addendum)
A few things to remember from today's visit:   Essential hypertension  Constipation, unspecified constipation type   No changes today.   Lab Results  Component Value Date   CHOL 236 (H) 12/10/2016   HDL 90.30 12/10/2016   LDLCALC 127 (H) 12/10/2016   TRIG 92.0 12/10/2016   CHOLHDL 3 12/10/2016   Lab Results  Component Value Date   CREATININE 0.79 12/10/2016   BUN 16 12/10/2016   NA 140 12/10/2016   K 3.8 12/10/2016   CL 104 12/10/2016   CO2 28 12/10/2016   Lab Results  Component Value Date   WBC 6.8 12/10/2016   HGB 13.4 12/10/2016   HCT 40.4 12/10/2016   MCV 96.1 12/10/2016   PLT 282.0 12/10/2016     Please be sure medication list is accurate. If a new problem present, please set up appointment sooner than planned today.

## 2017-01-22 NOTE — Progress Notes (Signed)
Pre visit review using our clinic review tool, if applicable. No additional management support is needed unless otherwise documented below in the visit note. 

## 2017-01-23 DIAGNOSIS — Z1231 Encounter for screening mammogram for malignant neoplasm of breast: Secondary | ICD-10-CM | POA: Diagnosis not present

## 2017-05-25 DIAGNOSIS — E559 Vitamin D deficiency, unspecified: Secondary | ICD-10-CM | POA: Diagnosis not present

## 2017-05-25 DIAGNOSIS — E7800 Pure hypercholesterolemia, unspecified: Secondary | ICD-10-CM | POA: Diagnosis not present

## 2017-05-25 DIAGNOSIS — I10 Essential (primary) hypertension: Secondary | ICD-10-CM | POA: Diagnosis not present

## 2017-05-25 DIAGNOSIS — Z90710 Acquired absence of both cervix and uterus: Secondary | ICD-10-CM | POA: Diagnosis not present

## 2017-07-27 ENCOUNTER — Ambulatory Visit: Payer: Medicare Other | Admitting: Family Medicine

## 2017-08-12 DIAGNOSIS — Z23 Encounter for immunization: Secondary | ICD-10-CM | POA: Diagnosis not present

## 2017-12-16 ENCOUNTER — Other Ambulatory Visit: Payer: Self-pay | Admitting: Internal Medicine

## 2017-12-16 DIAGNOSIS — Z0000 Encounter for general adult medical examination without abnormal findings: Secondary | ICD-10-CM | POA: Diagnosis not present

## 2017-12-16 DIAGNOSIS — E2839 Other primary ovarian failure: Secondary | ICD-10-CM | POA: Diagnosis not present

## 2017-12-16 DIAGNOSIS — Z1211 Encounter for screening for malignant neoplasm of colon: Secondary | ICD-10-CM | POA: Diagnosis not present

## 2017-12-16 DIAGNOSIS — Z1231 Encounter for screening mammogram for malignant neoplasm of breast: Secondary | ICD-10-CM

## 2017-12-16 DIAGNOSIS — E7800 Pure hypercholesterolemia, unspecified: Secondary | ICD-10-CM | POA: Diagnosis not present

## 2017-12-16 DIAGNOSIS — Z1389 Encounter for screening for other disorder: Secondary | ICD-10-CM | POA: Diagnosis not present

## 2017-12-16 DIAGNOSIS — I10 Essential (primary) hypertension: Secondary | ICD-10-CM | POA: Diagnosis not present

## 2018-01-15 ENCOUNTER — Ambulatory Visit: Payer: Medicare Other

## 2018-01-15 ENCOUNTER — Ambulatory Visit
Admission: RE | Admit: 2018-01-15 | Discharge: 2018-01-15 | Disposition: A | Discharge status: Home / Self Care | Payer: Medicare Other | Source: Ambulatory Visit | Attending: Internal Medicine | Admitting: Internal Medicine

## 2018-01-15 DIAGNOSIS — Z1231 Encounter for screening mammogram for malignant neoplasm of breast: Secondary | ICD-10-CM

## 2018-03-12 DIAGNOSIS — R457 State of emotional shock and stress, unspecified: Secondary | ICD-10-CM | POA: Diagnosis not present

## 2018-03-12 DIAGNOSIS — F419 Anxiety disorder, unspecified: Secondary | ICD-10-CM | POA: Diagnosis not present

## 2018-03-22 DIAGNOSIS — R42 Dizziness and giddiness: Secondary | ICD-10-CM | POA: Diagnosis not present

## 2018-03-22 DIAGNOSIS — F5101 Primary insomnia: Secondary | ICD-10-CM | POA: Diagnosis not present

## 2018-03-22 DIAGNOSIS — F419 Anxiety disorder, unspecified: Secondary | ICD-10-CM | POA: Diagnosis not present

## 2018-04-21 ENCOUNTER — Ambulatory Visit
Admission: RE | Admit: 2018-04-21 | Discharge: 2018-04-21 | Disposition: A | Discharge status: Home / Self Care | Payer: Medicare Other | Source: Ambulatory Visit | Attending: Nurse Practitioner | Admitting: Nurse Practitioner

## 2018-04-21 ENCOUNTER — Other Ambulatory Visit: Payer: Self-pay | Admitting: Nurse Practitioner

## 2018-04-21 DIAGNOSIS — M7989 Other specified soft tissue disorders: Secondary | ICD-10-CM | POA: Diagnosis not present

## 2018-04-21 DIAGNOSIS — M79642 Pain in left hand: Secondary | ICD-10-CM

## 2018-04-21 DIAGNOSIS — S6992XA Unspecified injury of left wrist, hand and finger(s), initial encounter: Secondary | ICD-10-CM | POA: Diagnosis not present

## 2018-04-29 DIAGNOSIS — E7800 Pure hypercholesterolemia, unspecified: Secondary | ICD-10-CM | POA: Diagnosis not present

## 2018-04-29 DIAGNOSIS — I10 Essential (primary) hypertension: Secondary | ICD-10-CM | POA: Diagnosis not present

## 2018-04-29 DIAGNOSIS — E559 Vitamin D deficiency, unspecified: Secondary | ICD-10-CM | POA: Diagnosis not present

## 2018-04-29 DIAGNOSIS — F5101 Primary insomnia: Secondary | ICD-10-CM | POA: Diagnosis not present

## 2018-07-08 DIAGNOSIS — Z23 Encounter for immunization: Secondary | ICD-10-CM | POA: Diagnosis not present

## 2018-08-20 DIAGNOSIS — I10 Essential (primary) hypertension: Secondary | ICD-10-CM | POA: Diagnosis not present

## 2018-08-20 DIAGNOSIS — M859 Disorder of bone density and structure, unspecified: Secondary | ICD-10-CM | POA: Diagnosis not present

## 2018-08-20 DIAGNOSIS — H2513 Age-related nuclear cataract, bilateral: Secondary | ICD-10-CM | POA: Diagnosis not present

## 2018-09-06 DIAGNOSIS — H353131 Nonexudative age-related macular degeneration, bilateral, early dry stage: Secondary | ICD-10-CM | POA: Diagnosis not present

## 2018-09-06 DIAGNOSIS — H40023 Open angle with borderline findings, high risk, bilateral: Secondary | ICD-10-CM | POA: Diagnosis not present

## 2018-09-06 DIAGNOSIS — H40033 Anatomical narrow angle, bilateral: Secondary | ICD-10-CM | POA: Diagnosis not present

## 2018-09-06 DIAGNOSIS — H2513 Age-related nuclear cataract, bilateral: Secondary | ICD-10-CM | POA: Diagnosis not present

## 2018-09-07 DIAGNOSIS — H2513 Age-related nuclear cataract, bilateral: Secondary | ICD-10-CM | POA: Diagnosis not present

## 2018-09-11 IMAGING — DX DG HAND COMPLETE 3+V*L*
3 series · 3 of 3 positions shown · non-contrast
Comparison: None.

CLINICAL DATA: Slammed hand with car door day ago with pain and
swelling the left thumb and first MCP joint

EXAM:
LEFT HAND - COMPLETE 3+ VIEW

[dg hand complete left (1 of 3)]
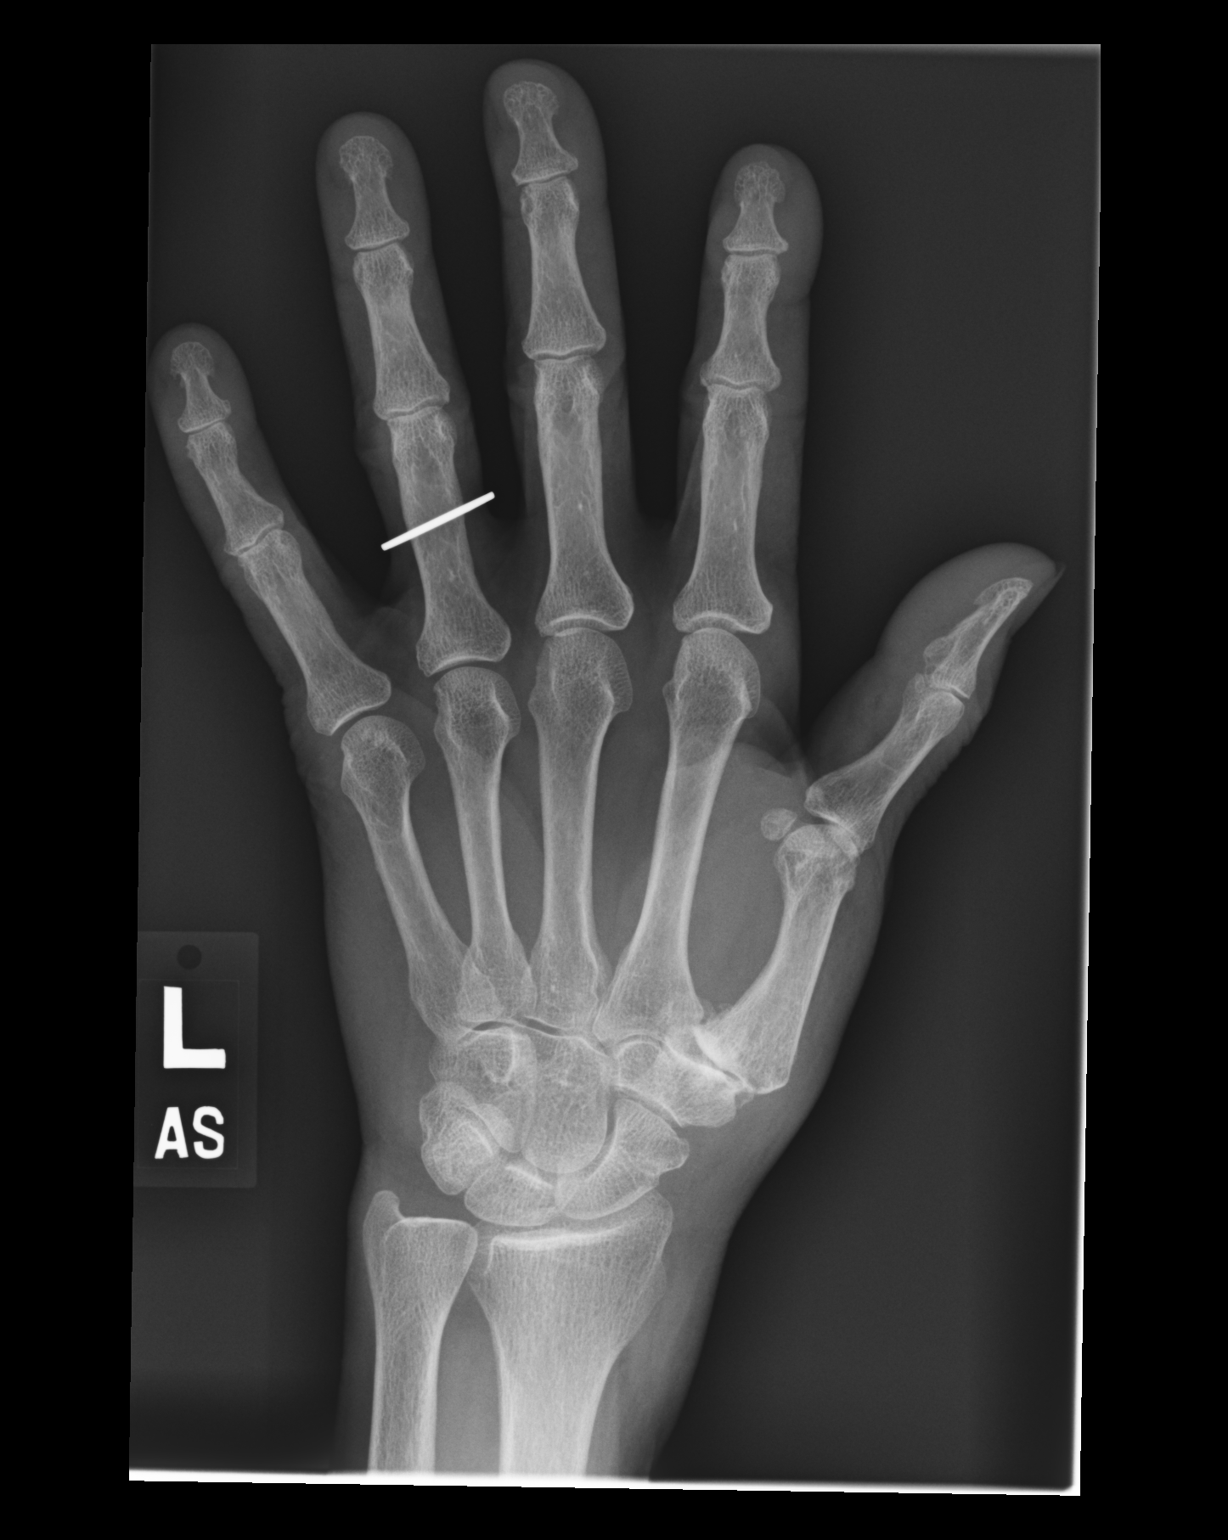

[dg hand complete left (2 of 3)]
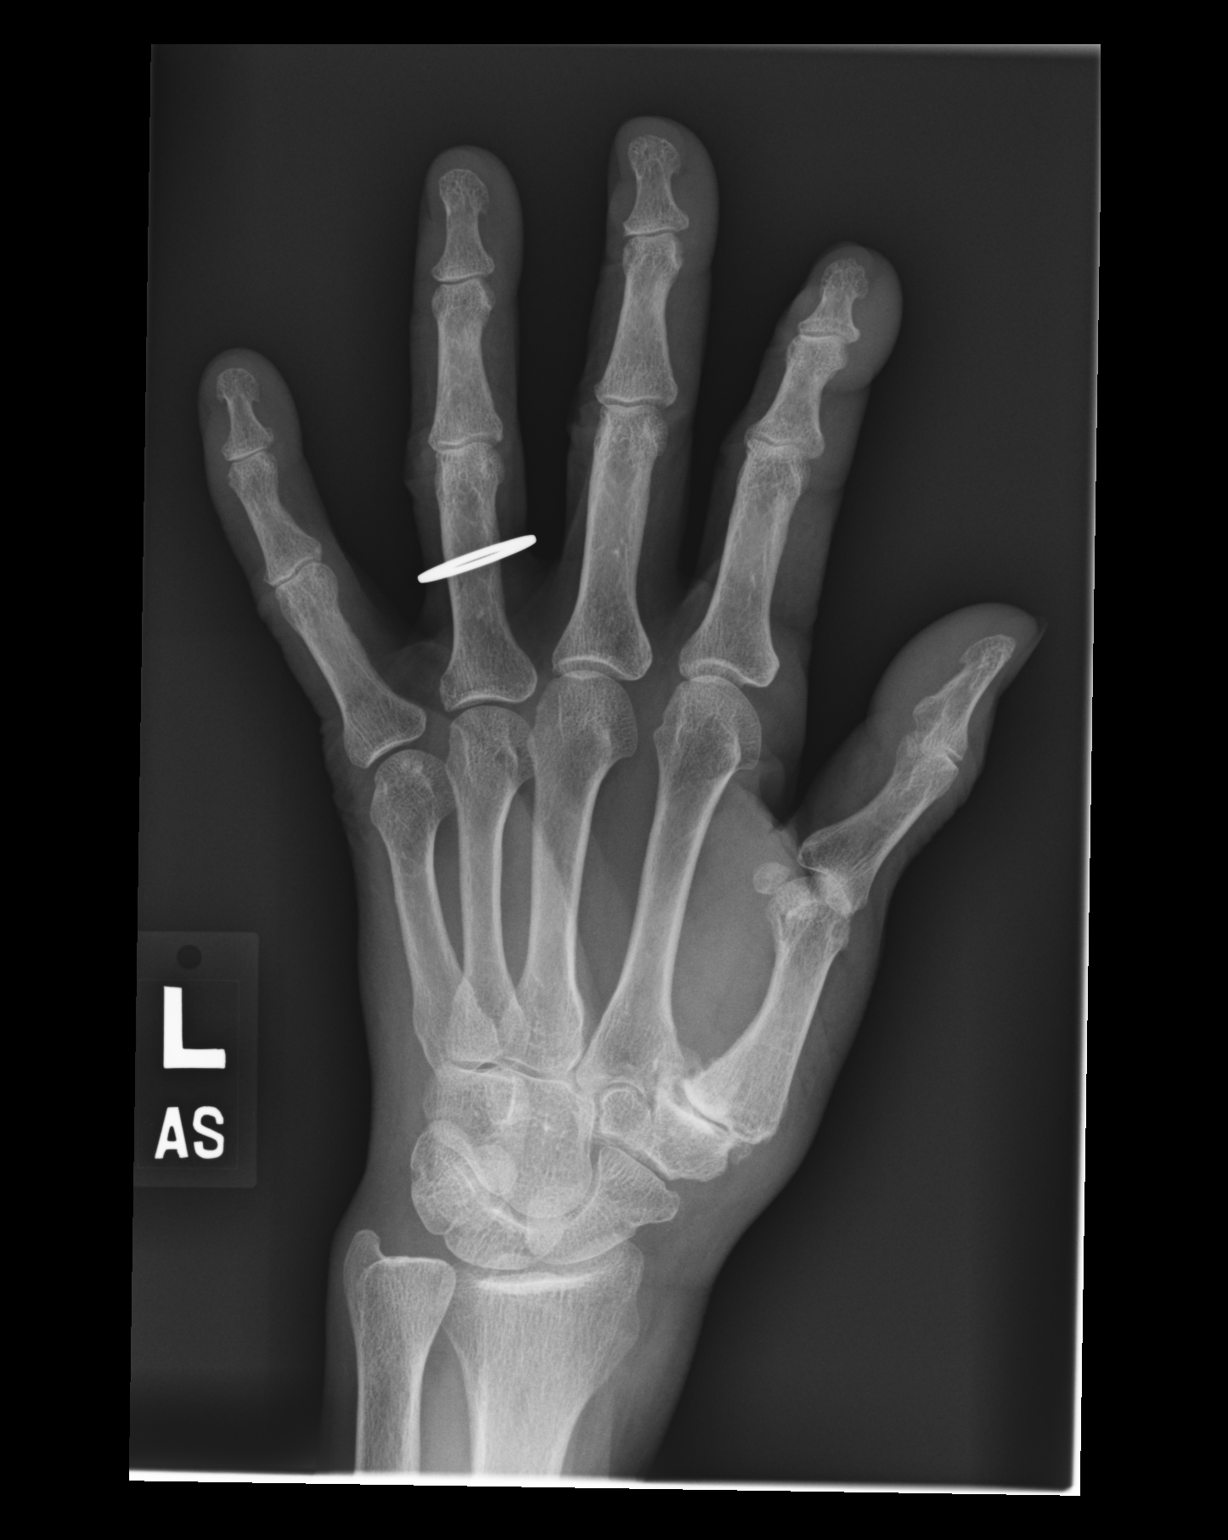

[dg hand complete left (3 of 3)]
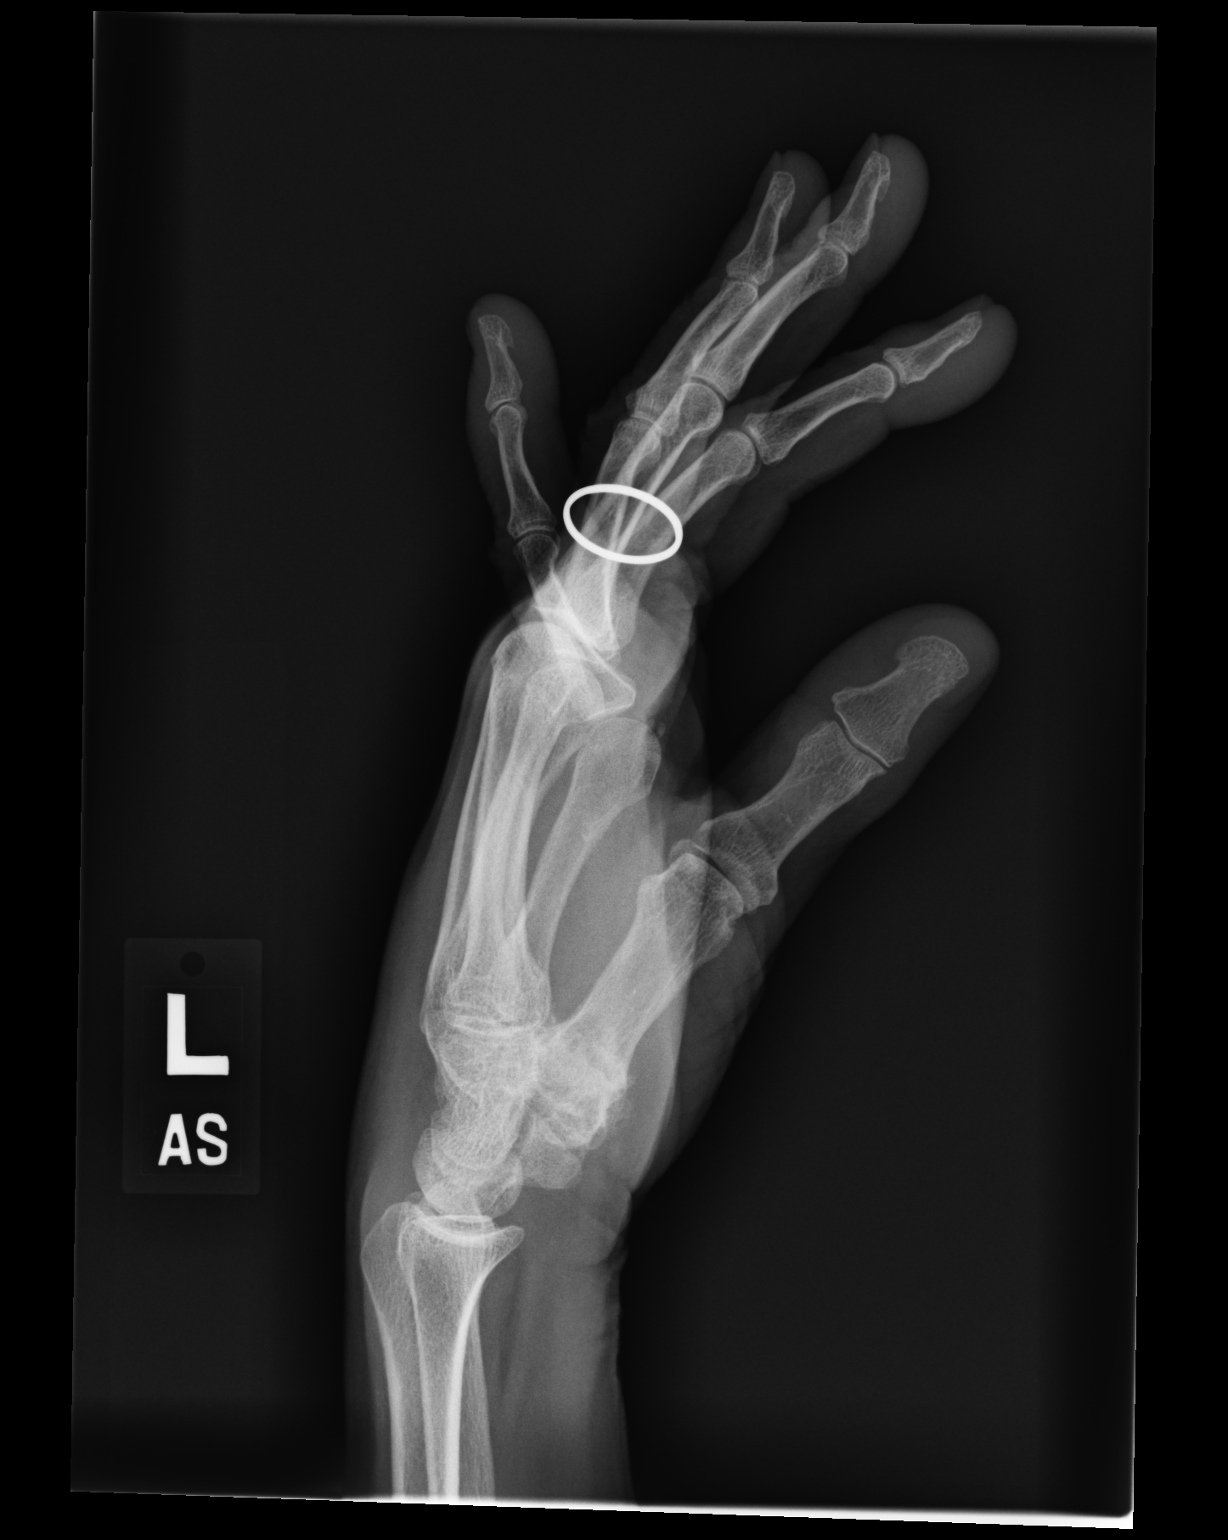

[3 of 3 positions shown; findings below may reference images not displayed]

FINDINGS: No acute fracture is seen. There is degenerative change involving
the left first CMC joint with loss of joint space, sclerosis and
spurring present. MCP and PIP joints appear normal with very mild
degenerative change of the DIP joints. The radiocarpal joint space
appears normal.
IMPRESSION: 1. No acute fracture.
2. Degenerative change of the left first CMC joint.

## 2018-10-12 DIAGNOSIS — H2513 Age-related nuclear cataract, bilateral: Secondary | ICD-10-CM | POA: Diagnosis not present

## 2018-10-12 DIAGNOSIS — Z01818 Encounter for other preprocedural examination: Secondary | ICD-10-CM | POA: Diagnosis not present

## 2018-10-12 DIAGNOSIS — Z6820 Body mass index (BMI) 20.0-20.9, adult: Secondary | ICD-10-CM | POA: Diagnosis not present

## 2018-10-12 DIAGNOSIS — I10 Essential (primary) hypertension: Secondary | ICD-10-CM | POA: Diagnosis not present

## 2018-10-14 DIAGNOSIS — M8589 Other specified disorders of bone density and structure, multiple sites: Secondary | ICD-10-CM | POA: Diagnosis not present

## 2018-10-14 DIAGNOSIS — M859 Disorder of bone density and structure, unspecified: Secondary | ICD-10-CM | POA: Diagnosis not present

## 2018-10-27 DIAGNOSIS — H2511 Age-related nuclear cataract, right eye: Secondary | ICD-10-CM | POA: Diagnosis not present

## 2018-10-27 DIAGNOSIS — H2513 Age-related nuclear cataract, bilateral: Secondary | ICD-10-CM | POA: Diagnosis not present

## 2018-11-10 DIAGNOSIS — F419 Anxiety disorder, unspecified: Secondary | ICD-10-CM | POA: Diagnosis not present

## 2018-11-10 DIAGNOSIS — H25012 Cortical age-related cataract, left eye: Secondary | ICD-10-CM | POA: Diagnosis not present

## 2018-11-10 DIAGNOSIS — H2512 Age-related nuclear cataract, left eye: Secondary | ICD-10-CM | POA: Diagnosis not present

## 2018-11-10 DIAGNOSIS — I10 Essential (primary) hypertension: Secondary | ICD-10-CM | POA: Diagnosis not present
